# Patient Record
Sex: Male | Born: 1995 | Race: White | Hispanic: No | Marital: Single
Health system: Southern US, Community
[De-identification: ages and names within clinical notes are randomized; demographics above are authoritative.]

---
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
## 2018-07-26 ENCOUNTER — Other Ambulatory Visit: Payer: Self-pay

## 2018-07-26 ENCOUNTER — Emergency Department
Admission: EM | Admit: 2018-07-26 | Discharge: 2018-07-27 | Disposition: A | Discharge status: Home / Self Care | Payer: BLUE CROSS/BLUE SHIELD | Attending: Emergency Medicine | Admitting: Emergency Medicine

## 2018-07-26 ENCOUNTER — Emergency Department: Payer: BLUE CROSS/BLUE SHIELD

## 2018-07-26 DIAGNOSIS — K529 Noninfective gastroenteritis and colitis, unspecified: Secondary | ICD-10-CM | POA: Diagnosis not present

## 2018-07-26 DIAGNOSIS — A0472 Enterocolitis due to Clostridium difficile, not specified as recurrent: Secondary | ICD-10-CM | POA: Insufficient documentation

## 2018-07-26 DIAGNOSIS — R197 Diarrhea, unspecified: Secondary | ICD-10-CM | POA: Diagnosis present

## 2018-07-26 DIAGNOSIS — E860 Dehydration: Secondary | ICD-10-CM | POA: Insufficient documentation

## 2018-07-26 LAB — CBC WITH DIFFERENTIAL/PLATELET
Abs Immature Granulocytes: 0.02 10*3/uL (ref 0.00–0.07)
Basophils Absolute: 0 10*3/uL (ref 0.0–0.1)
Basophils Relative: 0 %
EOS ABS: 0 10*3/uL (ref 0.0–0.5)
EOS PCT: 0 %
HEMATOCRIT: 47.9 % (ref 39.0–52.0)
Hemoglobin: 16 g/dL (ref 13.0–17.0)
Immature Granulocytes: 0 %
LYMPHS PCT: 10 %
Lymphs Abs: 0.8 10*3/uL (ref 0.7–4.0)
MCH: 29.8 pg (ref 26.0–34.0)
MCHC: 33.4 g/dL (ref 30.0–36.0)
MCV: 89.2 fL (ref 80.0–100.0)
MONO ABS: 0.1 10*3/uL (ref 0.1–1.0)
Monocytes Relative: 1 %
Neutro Abs: 6.7 10*3/uL (ref 1.7–7.7)
Neutrophils Relative %: 89 %
Platelets: 208 10*3/uL (ref 150–400)
RBC: 5.37 MIL/uL (ref 4.22–5.81)
RDW: 13.4 % (ref 11.5–15.5)
WBC: 7.6 10*3/uL (ref 4.0–10.5)
nRBC: 0 % (ref 0.0–0.2)

## 2018-07-26 LAB — URINALYSIS, COMPLETE (UACMP) WITH MICROSCOPIC
Bacteria, UA: NONE SEEN
Bilirubin Urine: NEGATIVE
GLUCOSE, UA: NEGATIVE mg/dL
Hgb urine dipstick: NEGATIVE
Ketones, ur: 20 mg/dL — AB
LEUKOCYTE UA: NEGATIVE
Nitrite: NEGATIVE
PH: 6 (ref 5.0–8.0)
Protein, ur: 100 mg/dL — AB
SPECIFIC GRAVITY, URINE: 1.034 — AB (ref 1.005–1.030)
SQUAMOUS EPITHELIAL / LPF: NONE SEEN (ref 0–5)

## 2018-07-26 LAB — COMPREHENSIVE METABOLIC PANEL
ALT: 47 U/L — ABNORMAL HIGH (ref 0–44)
ANION GAP: 12 (ref 5–15)
AST: 28 U/L (ref 15–41)
Albumin: 4.3 g/dL (ref 3.5–5.0)
Alkaline Phosphatase: 38 U/L (ref 38–126)
BILIRUBIN TOTAL: 0.9 mg/dL (ref 0.3–1.2)
BUN: 19 mg/dL (ref 6–20)
CALCIUM: 8.7 mg/dL — AB (ref 8.9–10.3)
CO2: 21 mmol/L — ABNORMAL LOW (ref 22–32)
Chloride: 103 mmol/L (ref 98–111)
Creatinine, Ser: 0.94 mg/dL (ref 0.61–1.24)
GLUCOSE: 101 mg/dL — AB (ref 70–99)
Potassium: 3.2 mmol/L — ABNORMAL LOW (ref 3.5–5.1)
Sodium: 136 mmol/L (ref 135–145)
TOTAL PROTEIN: 8.2 g/dL — AB (ref 6.5–8.1)

## 2018-07-26 LAB — C DIFFICILE QUICK SCREEN W PCR REFLEX
C DIFFICILE (CDIFF) TOXIN: NEGATIVE
C Diff antigen: POSITIVE — AB

## 2018-07-26 MED ORDER — SODIUM CHLORIDE 0.9 % IV SOLN
Freq: Once | INTRAVENOUS | Status: AC
  Administered 2018-07-26: 23:00:00 via INTRAVENOUS

## 2018-07-26 MED ORDER — ONDANSETRON HCL 4 MG/2ML IJ SOLN
4.0000 mg | Freq: Once | INTRAMUSCULAR | Status: AC
  Administered 2018-07-26: 4 mg via INTRAVENOUS
  Filled 2018-07-26: qty 2

## 2018-07-26 MED ORDER — ONDANSETRON 4 MG PO TBDP: 4.0000 mg | ORAL_TABLET | Freq: Three times a day (TID) | ORAL | 0 refills | Status: AC | PRN

## 2018-07-26 MED ORDER — SODIUM CHLORIDE 0.9 % IV SOLN
Freq: Once | INTRAVENOUS | Status: AC
  Administered 2018-07-26: via INTRAVENOUS

## 2018-07-26 NOTE — Discharge Instructions (Addendum)
Please seek medical attention for any high fevers, chest pain, shortness of breath, change in behavior, persistent vomiting, bloody stool or any other new or concerning symptoms.  

## 2018-07-26 NOTE — ED Triage Notes (Signed)
Patient felt sick just prior to boarding plane in [place] yesterday, after arriving home had nausea, vomiting and diarrhea.  Today just shaky.

## 2018-07-26 NOTE — ED Notes (Signed)
Patient transported to X-ray 

## 2018-07-26 NOTE — ED Notes (Signed)
Patient to triage via wheelchair by EMS.  EMS reports patient with complaint of nausea and vomiting. No vomiting today, just shaky. BP- 162/84, T- 100.3,  HR - 140's, pulse oxi 93% room air

## 2018-07-26 NOTE — ED Provider Notes (Signed)
[facility] Emergency Department Provider Note       Time seen: ----------------------------------------- 10:51 PM on 07/26/2018 -----------------------------------------   I have reviewed the triage vital signs and the nursing notes.  HISTORY   Chief Complaint Emesis and Diarrhea    HPI Isaac Lane is a 23 y.o. male with no significant past medical history who presents to the ED for nausea, vomiting diarrhea.  Patient states he felt sick prior to boarding a plane in [place] yesterday, arrived home he had nausea, vomiting or diarrhea.  He has not had any other flu symptoms.  No past medical history on file.  There are no active problems to display for this patient.  Allergies Patient has no known allergies.  Social History Social History   Tobacco Use  . Smoking status: Not on file  Substance Use Topics  . Alcohol use: Not on file  . Drug use: Not on file    Review of Systems Constitutional: Positive for fever Cardiovascular: Negative for chest pain. Respiratory: Negative for shortness of breath. Gastrointestinal: Positive for abdominal pain, vomiting and diarrhea Musculoskeletal: Negative for back pain. Skin: Negative for rash. Neurological: Negative for headaches, focal weakness or numbness.  All systems negative/normal/unremarkable except as stated in the HPI  ____________________________________________   PHYSICAL EXAM:  VITAL SIGNS: ED Triage Vitals [07/26/18 2159]  Enc Vitals Group     BP 128/61     Pulse Rate (!) 145     Resp 18     Temp (!) 100.4 F (38 C)     Temp Source Oral     SpO2 94 %     Weight (!) 320 lb (145.2 kg)     Height 6\' 4"  (1.93 m)     Head Circumference      Peak Flow      Pain Score 0     Pain Loc      Pain Edu?      Excl. in GC?    Constitutional: Alert and oriented.  Mild distress, actively vomiting Eyes: Conjunctivae are normal. Normal extraocular movements. ENT      Head:  Normocephalic and atraumatic.      Nose: No congestion/rhinnorhea.      Mouth/Throat: Mucous membranes are moist.      Neck: No stridor. Cardiovascular: Normal rate, regular rhythm. No murmurs, rubs, or gallops. Respiratory: Normal respiratory effort without tachypnea nor retractions. Breath sounds are clear and equal bilaterally. No wheezes/rales/rhonchi. Gastrointestinal: Soft and nontender.  High-pitched bowel sounds Musculoskeletal: Nontender with normal range of motion in extremities. No lower extremity tenderness nor edema. Neurologic:  Normal speech and language. No gross focal neurologic deficits are appreciated.  Skin:  Skin is warm, dry and intact. No rash noted. Psychiatric: Mood and affect are normal. Speech and behavior are normal.  ____________________________________________  ED COURSE:  As part of my medical decision making, I reviewed the following data within the electronic MEDICAL RECORD NUMBER History obtained from family if available, nursing notes, old chart and ekg, as well as notes from prior ED visits. Patient presented for vomiting and diarrhea, we will assess with labs and imaging as indicated at this time.   Procedures ____________________________________________   LABS (pertinent positives/negatives)  Labs Reviewed  COMPREHENSIVE METABOLIC PANEL - Abnormal; Notable for the following components:      Result Value   Potassium 3.2 (*)    CO2 21 (*)    Glucose, Bld 101 (*)    Calcium 8.7 (*)  Total Protein 8.2 (*)    ALT 47 (*)    All other components within normal limits  URINALYSIS, COMPLETE (UACMP) WITH MICROSCOPIC - Abnormal; Notable for the following components:   Color, Urine AMBER (*)    APPearance CLEAR (*)    Specific Gravity, Urine 1.034 (*)    Ketones, ur 20 (*)    Protein, ur 100 (*)    All other components within normal limits  GASTROINTESTINAL PANEL BY PCR, STOOL (REPLACES STOOL CULTURE)  C DIFFICILE QUICK SCREEN W PCR REFLEX  CBC WITH  DIFFERENTIAL/PLATELET    RADIOLOGY Images were viewed by me  Abdomen 2 view Are still pending at this time ____________________________________________   DIFFERENTIAL DIAGNOSIS   Dehydration, electrolyte abnormality, gastroenteritis, colitis  FINAL ASSESSMENT AND PLAN  Gastroenteritis, dehydration   Plan: The patient had presented for symptoms of gastroenteritis. Patient's labs do reveal dehydration without any other acute abnormality. Patient's imaging is still pending.  Likely gastroenteritis.  He appears to be improving with fluids and antiemetics.   Ulice Dash, MD    Note: This note was generated in part or whole with voice recognition software. Voice recognition is usually quite accurate but there are transcription errors that can and very often do occur. I apologize for any typographical errors that were not detected and corrected.     Emily Filbert, MD 07/26/18 [phone]

## 2018-07-27 LAB — GASTROINTESTINAL PANEL BY PCR, STOOL (REPLACES STOOL CULTURE)
Adenovirus F40/41: NOT DETECTED
Astrovirus: NOT DETECTED
CRYPTOSPORIDIUM: NOT DETECTED
Campylobacter species: NOT DETECTED
Cyclospora cayetanensis: NOT DETECTED
ENTAMOEBA HISTOLYTICA: NOT DETECTED
ENTEROPATHOGENIC E COLI (EPEC): NOT DETECTED
Enteroaggregative E coli (EAEC): NOT DETECTED
Enterotoxigenic E coli (ETEC): NOT DETECTED
GIARDIA LAMBLIA: NOT DETECTED
NOROVIRUS GI/GII: NOT DETECTED
PLESIMONAS SHIGELLOIDES: NOT DETECTED
Rotavirus A: DETECTED — AB
SAPOVIRUS (I, II, IV, AND V): NOT DETECTED
SHIGELLA/ENTEROINVASIVE E COLI (EIEC): NOT DETECTED
Salmonella species: NOT DETECTED
Shiga like toxin producing E coli (STEC): NOT DETECTED
VIBRIO CHOLERAE: NOT DETECTED
VIBRIO SPECIES: NOT DETECTED
Yersinia enterocolitica: NOT DETECTED

## 2018-07-27 LAB — CLOSTRIDIUM DIFFICILE BY PCR, REFLEXED: CDIFFPCR: POSITIVE — AB

## 2018-07-27 MED ORDER — ONDANSETRON HCL 4 MG PO TABS: 4.0000 mg | ORAL_TABLET | Freq: Three times a day (TID) | ORAL | 0 refills | Status: AC | PRN

## 2018-07-27 MED ORDER — DICYCLOMINE HCL 20 MG PO TABS: 20.0000 mg | ORAL_TABLET | Freq: Three times a day (TID) | ORAL | 0 refills | Status: AC | PRN

## 2018-07-27 MED ORDER — VANCOMYCIN HCL 125 MG PO CAPS: 125.0000 mg | ORAL_CAPSULE | Freq: Four times a day (QID) | ORAL | 0 refills | Status: AC

## 2018-07-27 MED ORDER — SODIUM CHLORIDE 0.9 % IV BOLUS
1000.0000 mL | Freq: Once | INTRAVENOUS | Status: AC
Start: 2018-07-27 — End: 2018-07-27
  Administered 2018-07-27: 1000 mL via INTRAVENOUS

## 2018-07-27 NOTE — ED Provider Notes (Signed)
Patient significantly better after IV fluids and patient's C. difficile did come back positive.  Patient is symptomatic so will treat.  I discussed this finding with the patient.  Will prescribe antibiotics.  Also encouraged use of probiotics.  Encourage patient to follow-up with his primary care physician.   Phineas Semen, MD 07/27/18 [phone]

## 2018-07-27 NOTE — ED Notes (Signed)
Peripheral IV discontinued. Catheter intact. No signs of infiltration or redness. Gauze applied to IV site.    Discharge instructions reviewed with patient. Questions fielded by this RN. Patient verbalizes understanding of instructions. Patient discharged home in stable condition per goodman. No acute distress noted at time of discharge.    

## 2020-07-07 IMAGING — CR DG ABDOMEN 2V
1 series · 4 of 4 positions shown · non-contrast
Comparison: None.

CLINICAL DATA: Nausea, vomiting, and diarrhea. Sick feeling
yesterday prior to boarding a plain and [HOSPITAL].

EXAM:
ABDOMEN - 2 VIEW

[Series 1: dg abd 2 views · 0.14mm/px · 4 of 4 slices shown]
[im 1/4]
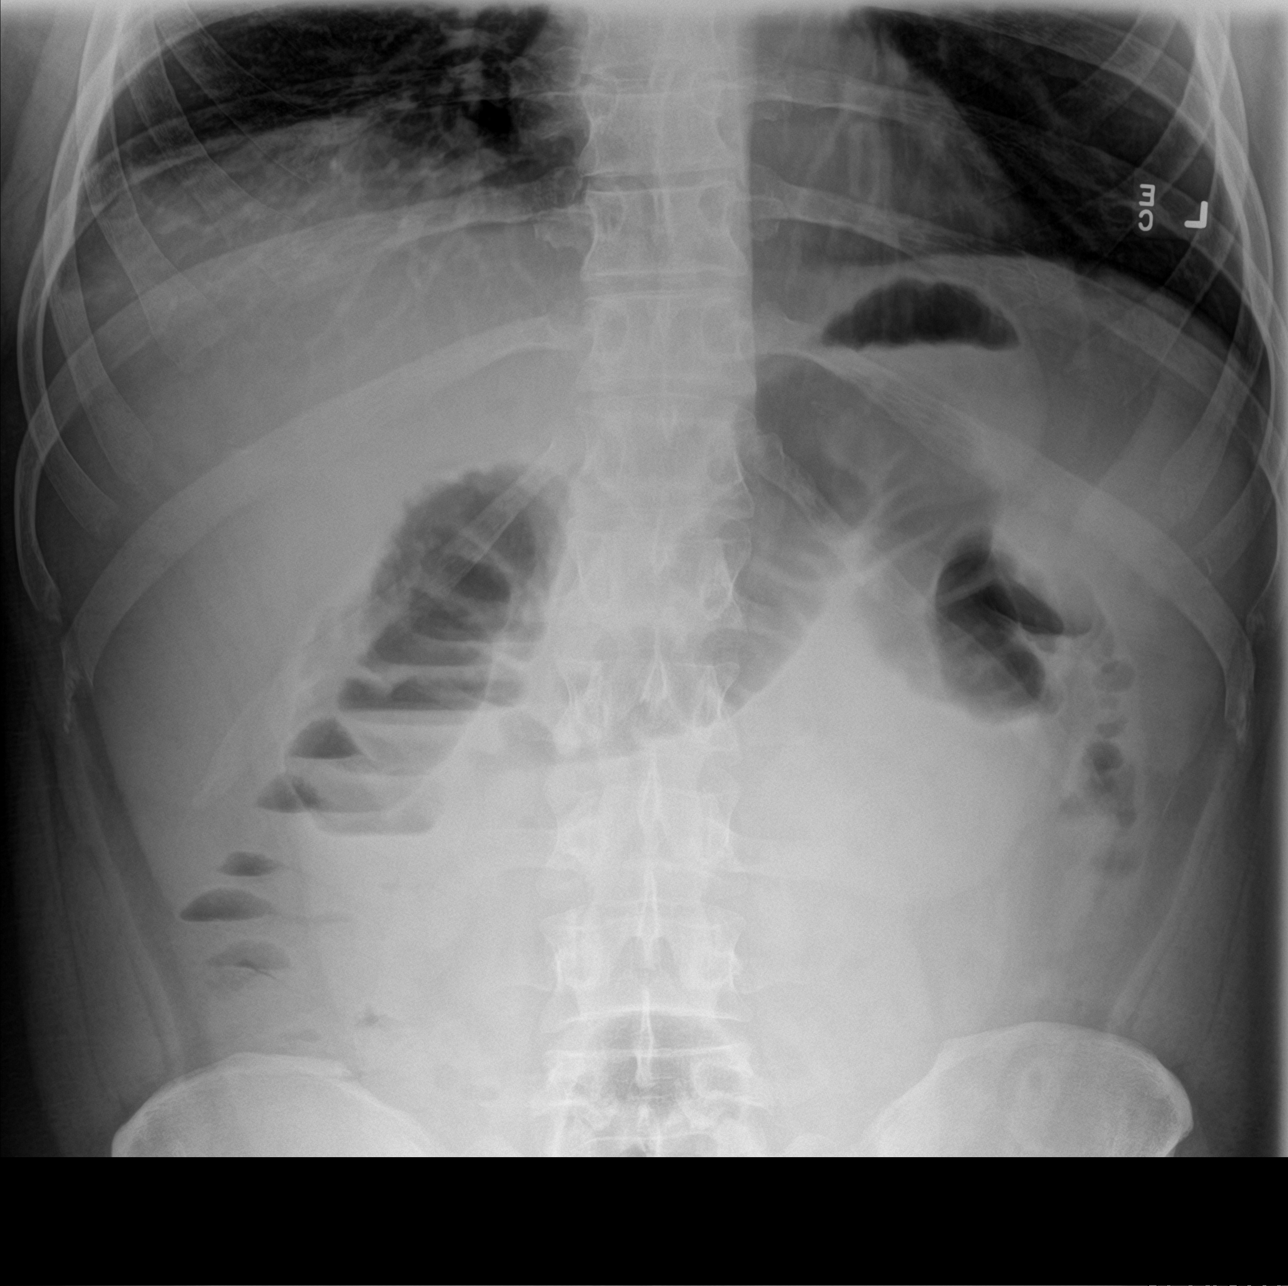
[im 2/4]
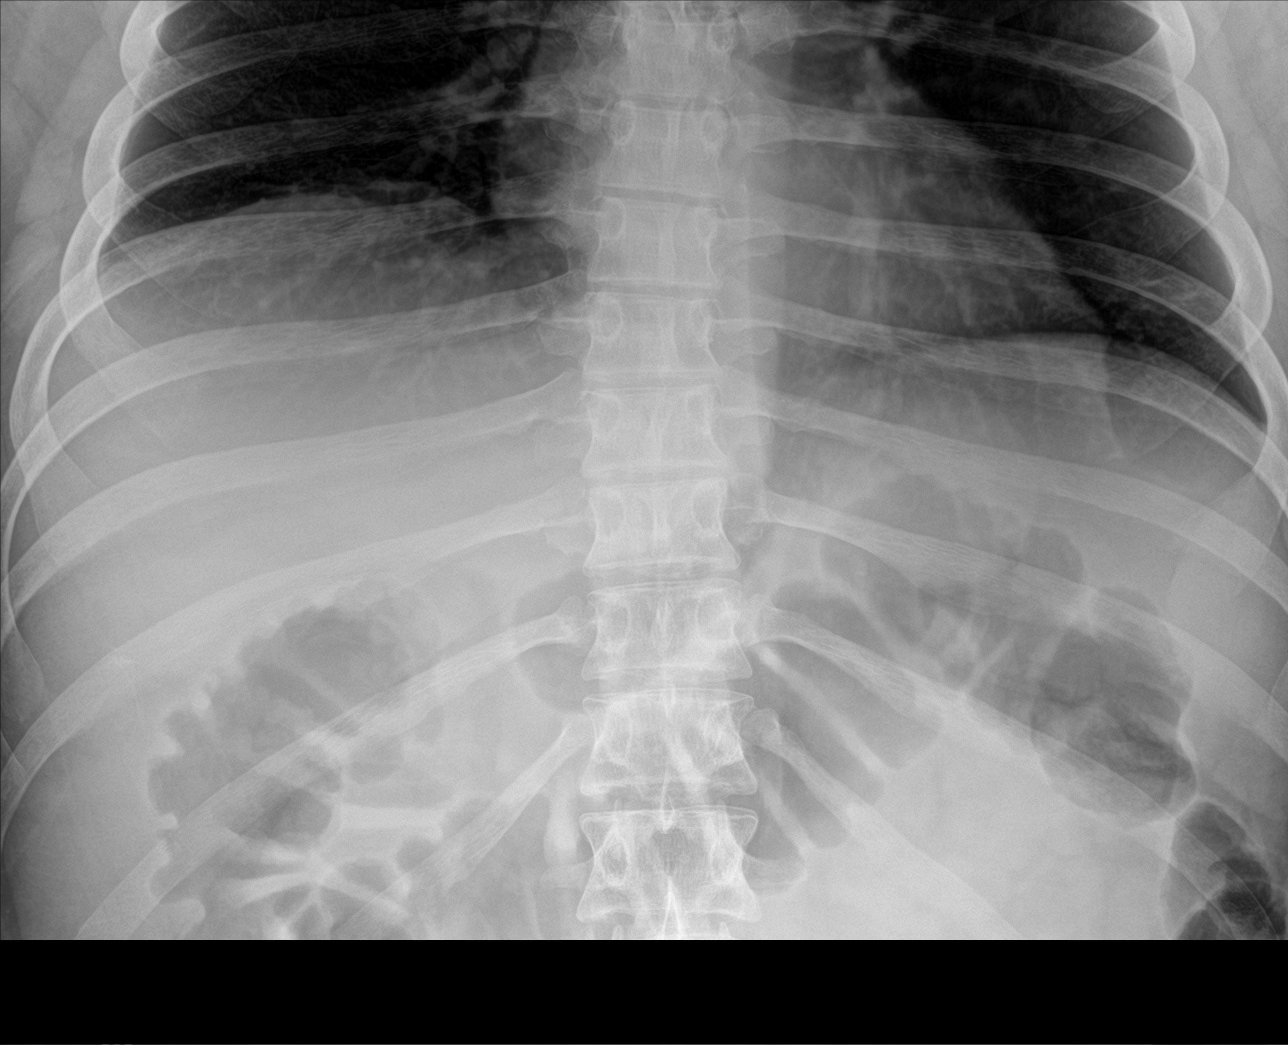
[im 3/4]
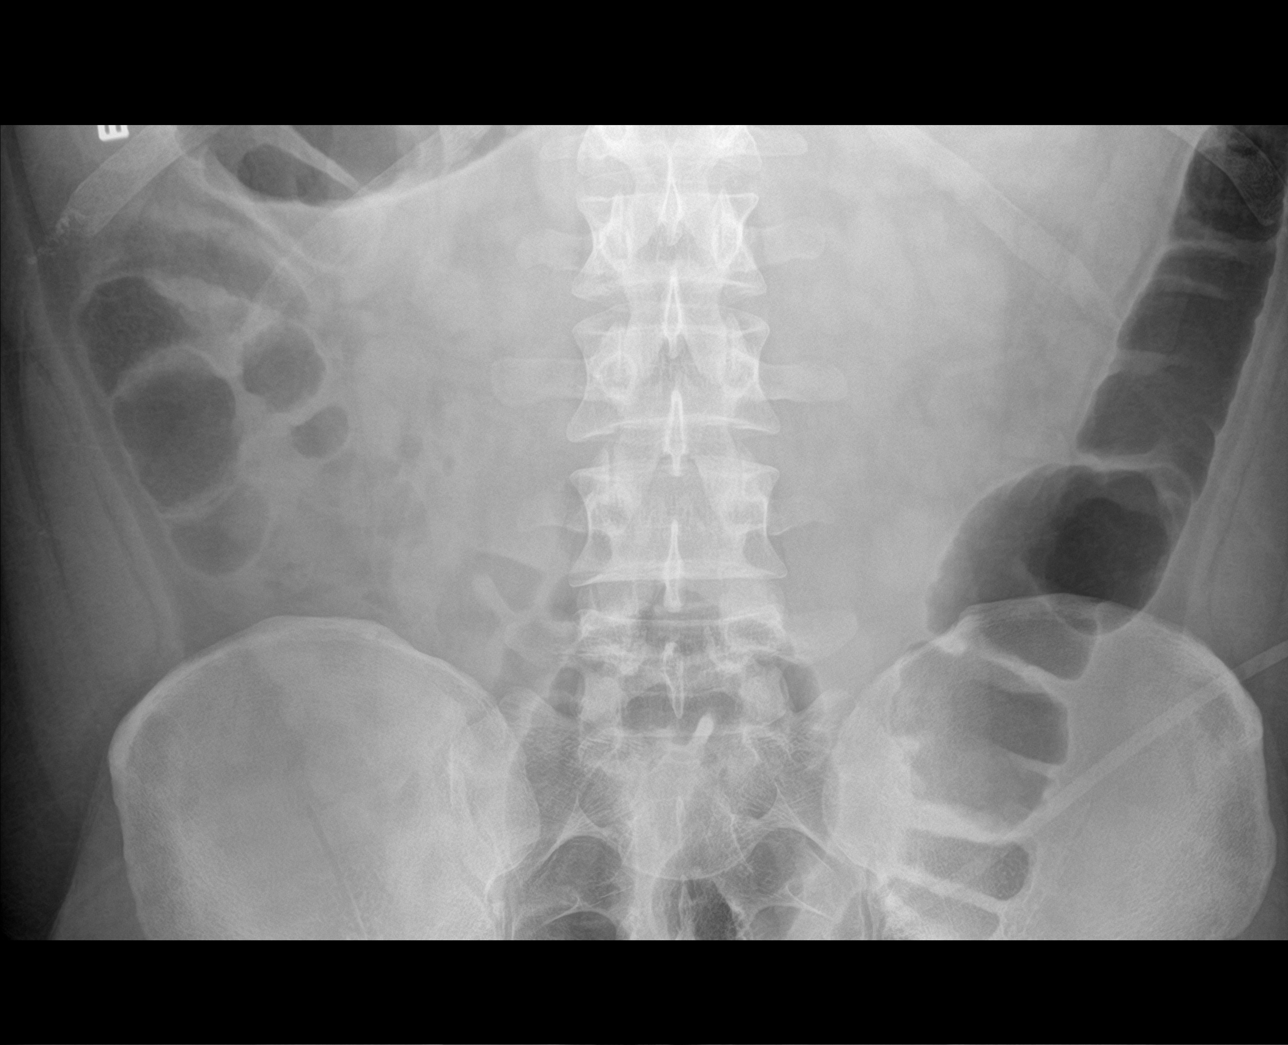
[im 4/4]
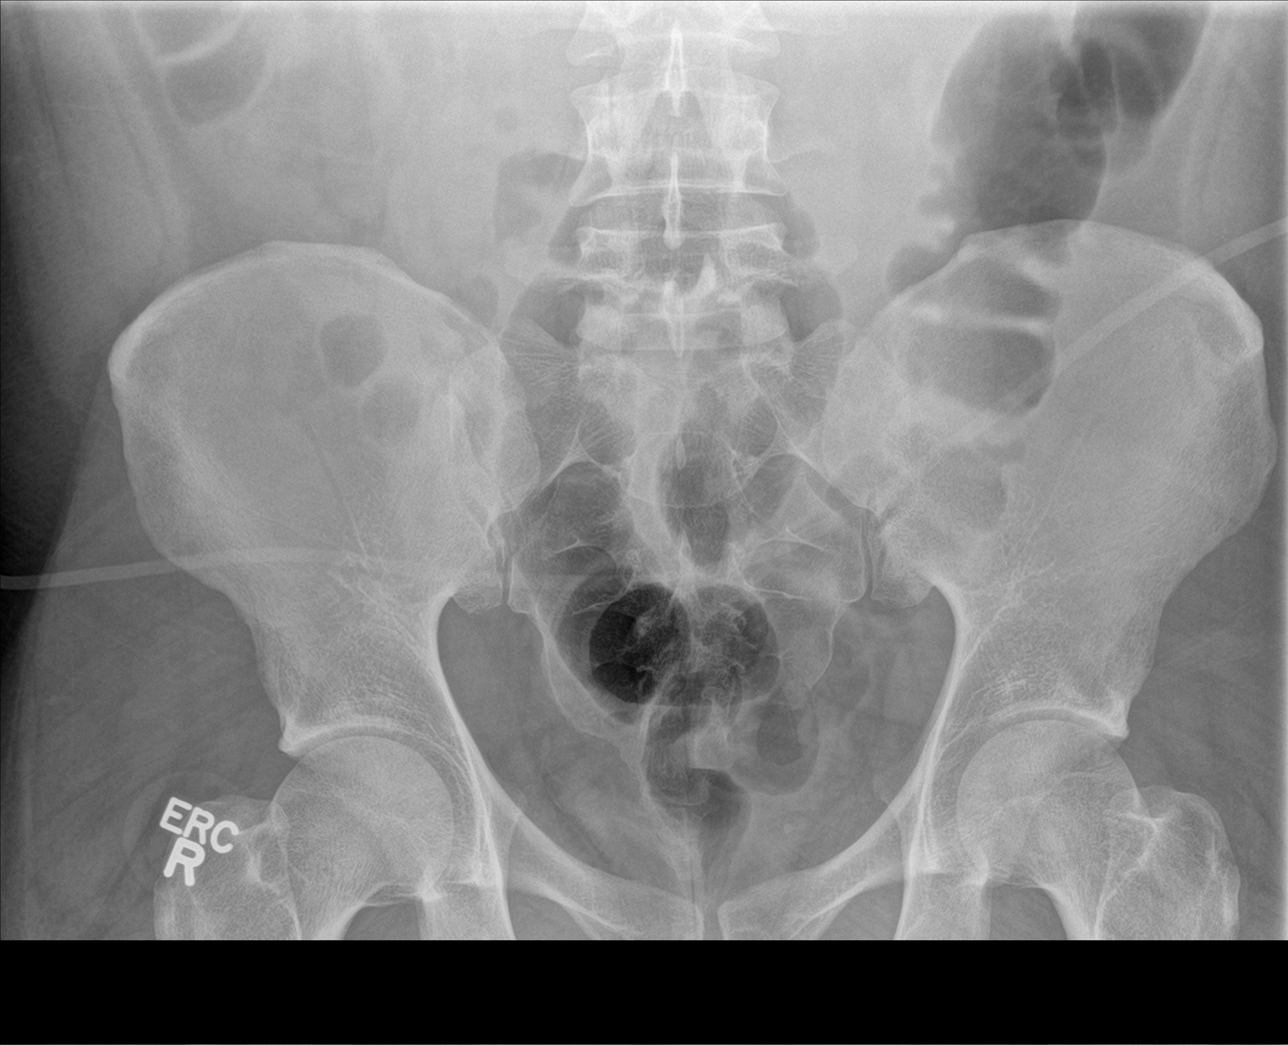

[4 of 4 positions shown; findings below may reference images not displayed]

FINDINGS: Colon is diffusely gas filled without significant distention. No
small bowel gas. Changes likely to represent colonic ileus.
Air-fluid levels in the colon may be associated with diarrhea. No
free intra-abdominal air. No radiopaque stones. Visualized bones and
soft tissue contours appear intact.
IMPRESSION: Gas-filled colon likely representing colonic ileus. No evidence of
obstruction.
